# Patient Record
Sex: Female | Born: 1935 | Hispanic: No | Smoking: Never smoker
Health system: Southern US, Community
[De-identification: ages and names within clinical notes are randomized; demographics above are authoritative.]

## PROBLEM LIST (undated history)

## (undated) DIAGNOSIS — B029 Zoster without complications: Secondary | ICD-10-CM

## (undated) DIAGNOSIS — E079 Disorder of thyroid, unspecified: Secondary | ICD-10-CM

## (undated) DIAGNOSIS — M810 Age-related osteoporosis without current pathological fracture: Secondary | ICD-10-CM

## (undated) DIAGNOSIS — C801 Malignant (primary) neoplasm, unspecified: Secondary | ICD-10-CM

## (undated) DIAGNOSIS — A4902 Methicillin resistant Staphylococcus aureus infection, unspecified site: Secondary | ICD-10-CM

## (undated) HISTORY — PX: SKIN BIOPSY: SHX1

## (undated) HISTORY — PX: ABDOMINAL HYSTERECTOMY: SHX81

## (undated) HISTORY — PX: FRACTURE SURGERY: SHX138

---

## 2007-06-22 ENCOUNTER — Ambulatory Visit: Payer: Self-pay | Admitting: Ophthalmology

## 2007-08-03 ENCOUNTER — Ambulatory Visit: Payer: Self-pay | Admitting: Ophthalmology

## 2007-08-03 ENCOUNTER — Ambulatory Visit: Payer: Self-pay | Admitting: Cardiology

## 2007-08-03 ENCOUNTER — Other Ambulatory Visit: Payer: Self-pay

## 2009-12-22 ENCOUNTER — Ambulatory Visit: Payer: Self-pay | Admitting: Internal Medicine

## 2010-01-06 ENCOUNTER — Ambulatory Visit: Payer: Self-pay | Admitting: Internal Medicine

## 2010-01-15 ENCOUNTER — Ambulatory Visit: Payer: Self-pay | Admitting: Internal Medicine

## 2010-09-01 ENCOUNTER — Ambulatory Visit: Payer: Self-pay | Admitting: Internal Medicine

## 2011-09-21 ENCOUNTER — Ambulatory Visit: Payer: Self-pay

## 2013-08-23 ENCOUNTER — Ambulatory Visit: Payer: Self-pay | Admitting: Family Medicine

## 2013-09-23 ENCOUNTER — Ambulatory Visit: Payer: Self-pay | Admitting: Internal Medicine

## 2013-09-23 LAB — COMPREHENSIVE METABOLIC PANEL
ANION GAP: 12 (ref 7–16)
Albumin: 2.9 g/dL — ABNORMAL LOW (ref 3.4–5.0)
Alkaline Phosphatase: 117 U/L
BUN: 20 mg/dL — ABNORMAL HIGH (ref 7–18)
Bilirubin,Total: 0.7 mg/dL (ref 0.2–1.0)
CALCIUM: 8.7 mg/dL (ref 8.5–10.1)
CHLORIDE: 99 mmol/L (ref 98–107)
CO2: 26 mmol/L (ref 21–32)
Creatinine: 1.32 mg/dL — ABNORMAL HIGH (ref 0.60–1.30)
EGFR (Non-African Amer.): 34 — ABNORMAL LOW
GFR CALC AF AMER: 40 — AB
GLUCOSE: 105 mg/dL — AB (ref 65–99)
Osmolality: 277 (ref 275–301)
Potassium: 3.9 mmol/L (ref 3.5–5.1)
SGOT(AST): 31 U/L (ref 15–37)
SGPT (ALT): 24 U/L (ref 12–78)
Sodium: 137 mmol/L (ref 136–145)
Total Protein: 7.4 g/dL (ref 6.4–8.2)

## 2013-09-23 LAB — CBC WITH DIFFERENTIAL/PLATELET
BASOS ABS: 0 10*3/uL (ref 0.0–0.1)
Basophil %: 0.1 %
Eosinophil #: 0.1 10*3/uL (ref 0.0–0.7)
Eosinophil %: 0.3 %
HCT: 28.5 % — ABNORMAL LOW (ref 35.0–47.0)
HGB: 9.5 g/dL — ABNORMAL LOW (ref 12.0–16.0)
LYMPHS PCT: 14.6 %
Lymphocyte #: 2.6 10*3/uL (ref 1.0–3.6)
MCH: 32.6 pg (ref 26.0–34.0)
MCHC: 33.3 g/dL (ref 32.0–36.0)
MCV: 98 fL (ref 80–100)
MONO ABS: 3.8 x10 3/mm — AB (ref 0.2–0.9)
Monocyte %: 20.7 %
NEUTROS PCT: 64.3 %
Neutrophil #: 11.6 10*3/uL — ABNORMAL HIGH (ref 1.4–6.5)
Platelet: 168 10*3/uL (ref 150–440)
RBC: 2.91 10*6/uL — AB (ref 3.80–5.20)
RDW: 15 % — AB (ref 11.5–14.5)
WBC: 18.1 10*3/uL — ABNORMAL HIGH (ref 3.6–11.0)

## 2013-10-11 ENCOUNTER — Ambulatory Visit: Payer: Self-pay | Admitting: Physician Assistant

## 2013-10-11 LAB — URINALYSIS, COMPLETE
BILIRUBIN, UR: NEGATIVE
BLOOD: NEGATIVE
Glucose,UR: NEGATIVE mg/dL (ref 0–75)
KETONE: NEGATIVE
Leukocyte Esterase: NEGATIVE
NITRITE: NEGATIVE
Ph: 8 (ref 4.5–8.0)
Protein: NEGATIVE
Specific Gravity: 1.01 (ref 1.003–1.030)

## 2013-10-16 ENCOUNTER — Ambulatory Visit: Payer: Self-pay

## 2014-02-27 ENCOUNTER — Ambulatory Visit: Payer: Self-pay | Admitting: Family Medicine

## 2014-02-27 LAB — URINALYSIS, COMPLETE
BILIRUBIN, UR: NEGATIVE
GLUCOSE, UR: NEGATIVE mg/dL (ref 0–75)
Ketone: NEGATIVE
Leukocyte Esterase: NEGATIVE
Nitrite: NEGATIVE
Ph: 7 (ref 4.5–8.0)
Protein: 100
Specific Gravity: 1.025 (ref 1.003–1.030)

## 2014-03-01 LAB — URINE CULTURE

## 2015-04-14 ENCOUNTER — Ambulatory Visit
Admission: EM | Admit: 2015-04-14 | Discharge: 2015-04-14 | Disposition: A | Discharge status: Home / Self Care | Payer: Medicare Other | Attending: Family Medicine | Admitting: Family Medicine

## 2015-04-14 ENCOUNTER — Encounter: Payer: Self-pay | Admitting: *Deleted

## 2015-04-14 DIAGNOSIS — L820 Inflamed seborrheic keratosis: Secondary | ICD-10-CM | POA: Diagnosis not present

## 2015-04-14 DIAGNOSIS — L089 Local infection of the skin and subcutaneous tissue, unspecified: Secondary | ICD-10-CM

## 2015-04-14 DIAGNOSIS — R42 Dizziness and giddiness: Secondary | ICD-10-CM | POA: Diagnosis not present

## 2015-04-14 DIAGNOSIS — R221 Localized swelling, mass and lump, neck: Secondary | ICD-10-CM | POA: Diagnosis not present

## 2015-04-14 HISTORY — DX: Zoster without complications: B02.9

## 2015-04-14 MED ORDER — MUPIROCIN 2 % EX OINT: 1.0000 "application " | TOPICAL_OINTMENT | Freq: Three times a day (TID) | CUTANEOUS | Status: DC

## 2015-04-14 NOTE — ED Notes (Signed)
Pt states that she is having painful swelling on the left side of her neck, started 1 week ago.

## 2015-04-14 NOTE — ED Provider Notes (Addendum)
CSN: [id number]     Arrival date & time 04/14/15  1342 History   First MD Initiated Contact with Patient 04/14/15 1455     Chief Complaint  Patient presents with  . Neck Pain   son brings patient in multiple problems multiple complaints. The #1 complaint is her neck pain. This neck pain is really a neck mass has been biopsied according to the son multiple years ago by Dr. Nehemiah Massed at [facility] dermatology. He states that at that time they were told that there was an internal cancer and because of her age nothing was to be done should be done. They were going to just watch her. Apparently over the last 1 or 2 weeks a mass has appeared and is grown. He cannot say whether this is the same exact site that was biopsied but obvious disc concern area unfortunate patient has a lot of other concern and during the time when the son is trying to explain things along with caregiver that you use it provides care for her on a weekly basis she is talking and has a lot of other concerns as well. 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 Son reports that she's having difficult sleeping because the discomfort and pain from this so he wants to have further evaluation done but the same time he does want her on any pain medication because as he states it Motrin 200 mg makes her loopy. Discuss with them whether evaluation at [facility] or [facility] might be better for biopsies can be done. He wants initial evaluation to be done in [place]. He states that he has difficult to getting her into see her PCP since she is already on Friday and he cycle be available this Friday because of his previous engagements.  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 He states that most times able to soak the lesion and it clears at this time lesion on the left forearm  healed very strange where that it was a lot of redundant tissue present. Lesions only small now but they're also concerned about that.  Third problem is a large lesion on the back of the been using a type and doing term bandage on this been bleeding and oozing for the last week or 2. Son states he thinks his mother's been scratching that area.   What was presented first but was the probable least treatable is that for the last 2 weeks she's been having episodic vertigo. While it was felt that I need to be notified of that problem they state that they're not concerned because she had vertigo years ago it waxes and wanes and has been much worst in the past and resolved on its own. Also they really don't want her on a extra medications either.   (Consider location/radiation/quality/duration/timing/severity/associated sxs/prior Treatment) Patient is a 81 y.o. female presenting with neck pain. The history is provided by the patient and a relative (the son).  Neck Pain Pain location:  Unable to specify Pain radiates to:  Does not radiate Pain severity:  Moderate Pain is:  Worse during the night Progression:  Waxing and waning Chronicity:  Recurrent Context: not fall, not lifting a heavy object, not MCA, not MVA, not pedestrian accident and not recent injury   Relieved by:  Nothing Worsened by:  Nothing tried Associated symptoms comment:  Cancer Risk factors: no hx of head and neck radiation, no hx of osteoporosis, no hx of spinal trauma, no recent epidural and no recent head injury     Past Medical History  Diagnosis Date  . Shingles    History reviewed. No pertinent past surgical history. No family history on file. Social History  Substance Use Topics  . Smoking status: Never Smoker   . Smokeless tobacco: None  . Alcohol Use: No   OB History    No data available     Review of Systems  Unable to perform ROS Constitutional: Positive for activity change.  Musculoskeletal: Positive  for neck pain.  Skin: Positive for color change and wound.  Neurological: Positive for dizziness.       Recurrent vertigo    Allergies  Review of patient's allergies indicates no known allergies.  Home Medications   Prior to Admission medications   Medication Sig Start Date End Date Taking? Authorizing Provider  aspirin 81 MG tablet Take 81 mg by mouth daily.   Yes Historical Provider, MD  Calcium-Vitamin D-Vitamin K (VIACTIV PO) Take by mouth.   Yes Historical Provider, MD  cetirizine (ZYRTEC) 10 MG tablet Take 10 mg by mouth daily.   Yes Historical Provider, MD  Cobalamine Combinations (FOLIC + N02) [id number] MCG TABS Take by mouth.   Yes Historical Provider, MD  cyanocobalamin 1000 MCG tablet Take 100 mcg by mouth daily.   Yes Historical Provider, MD  Docusate Sodium (COLACE PO) Take by mouth.   Yes Historical Provider, MD  ferrous sulfate 325 (65 FE) MG tablet Take 325 mg by mouth 2 (two) times daily with a meal.   Yes Historical Provider, MD  levothyroxine (SYNTHROID, LEVOTHROID) 125 MCG tablet Take 125 mcg by mouth daily before breakfast.   Yes Historical Provider, MD  mupirocin ointment (BACTROBAN) 2 % Apply 1 application topically 3 (three) times daily. 04/14/15   Frederich Cha, MD   Meds Ordered and Administered this Visit  Medications - No data to display  BP 119/67 mmHg  Pulse 88  Temp(Src) 98.5 F (36.9 C) (Oral)  Ht 5' (1.524 m)  Wt 99 lb (44.906 kg)  BMI 19.33 kg/m2  SpO2 100% No data found.   Physical Exam  Constitutional:  Very cachectic elderly white frail female  HENT:  Head: Atraumatic.    Right Ear: Tympanic membrane and external ear normal.  Left Ear: Tympanic membrane and external ear normal.  Nose: Nose normal.  Mouth/Throat: Oropharynx is clear and moist. Mucous membranes are dry. Normal dentition.  She has a very prominent and large lesion on the left side of her neck more towards the back. There is no signs of fluctuance or abscess and lesion  appears to be solid.  Eyes: Pupils are equal, round, and reactive to light.  Neck: Neck supple. No thyromegaly present.  Neurological:  Patient shows some confusion and rambling of speech and conversation.  Skin: Skin is warm. Rash noted. There is erythema.     Psychiatric: She has a normal mood and affect.  Vitals reviewed.   ED Course  Procedures (including critical care time)  Labs Review Labs Reviewed - No data to display  Imaging Review US Soft Tissue Head/neck  04/15/2015   CLINICAL DATA:  81 year old female with left neck mass  EXAM: ULTRASOUND OF HEAD/NECK SOFT TISSUES  TECHNIQUE: Ultrasound examination of the head and neck soft tissues was performed in the area  of clinical concern.  COMPARISON:  None.  FINDINGS: Sonographic interrogation of the region of palpable abnormality demonstrates a complex cystic structure with internal debris, and multiple vascular septations. The complex cystic mass measures approximately 4.0 x 2.0 x 3.6 cm. The mass begins just inferior to the earlobe and is just superficial to the neurovascular bundle.  IMPRESSION: Approximately 4 cm complex cystic mass with internal debris and vascular septation in the superficial soft tissues of the left neck. Differential considerations include abscess, necrotic malignant lymphadenopathy, and atypical (tuberculous) lymphadenitis.  Recommend further evaluation with CT scan of the neck with contrast.   Electronically Signed   By: Jacqulynn Cadet M.D.   On: 04/15/2015 14:47     Visual Acuity Review  Right Eye Distance:   Left Eye Distance:   Bilateral Distance:    Right Eye Near:   Left Eye Near:    Bilateral Near:         MDM   1. Mass of left side of neck   2. Vertigo   3. Keratosis, inflamed seborrheic   4. Infected skin lesion    #1 During this time felt that the left forearm is healing well and the may been some redundant tissue removed at nothing to do differently other than using the  Bactroban ointment prescribed for problem #2. #2 infected seborrheic keratosis. She has rather large seborrheic keratosis over her neck that bleeding as very angry. Apparently she's been scratching this lesion and is now bleeding. Put the duodenum dressing back on.strongly recommend Bactrim and ointment 1-3 times a day on the neck.  Follow-up with her PCP. Some inquire with a weaker freeze this lesion and the lesion the size of her not feel comfortable freezing. Dermatologist may be able to remove it but the same time because of her age not sure they will want to try to tackle remove this lesion.  #3 vertigo at this point time due nothing differently. #4 and most important thing is the mass on the left side of the neck this causing pain. I've explained to the son I will order ultrasound of the area to see whether this is solid or cystic antegrade get some feedback from the radiologist. Biopsy is going to have to be done but at her age this could be another issue as well. False 6. them that radiologist may want CT scan of her neck and then passed case they may have to travel from [facility] to [facility] He has given [place] his [phone] requested he be called with enough time to go from [facility] to her house to pick her up and bring her here do our best to get the ultrasound per his request on Monday morning with this being present for over a week I can't in good conscious ordered this stat.    Frederich Cha, MD 04/14/15 1809    Discussed the results of the ultrasound in person with patient and her son on 04-15-15 after the procedure was done. Given some copies of the ultrasound to the son and explained as I was concerned that CT scan is now being recommended and that also needs to be a CT scan with contrast so she will need at work to evaluate kidney function was not done yesterday. I stressed with him diagnosis of a neck cancer is still possible that I would recommend as I did yesterday referral to ENT at  [facility]/interventionalists radiologist to make the decision how best to evaluate her at this time. Get a biopsy  might be performed at the time of the CT scan but I can't say for sure. Since this is a ongoing problem and she has had a previous biopsy of the neck with results and I do not have access to I strongly suggest he goes through her PCP who has this information to figure out what's the best next step. She was able to sleep last night while they want answers I do not feel that just getting a CT scan at [facility] is our next best step. He has made appointment with her PCP for next week Thursday but he still seems concerned that they will have to wait till then. I explained to him that he might be able to give a report of the radiologist to the office and they maybe want to go ahead with the referral considering her age. His promised me he will contact her PCP office for the next step.  Frederich Cha, MD 04/15/15 [phone]

## 2015-04-14 NOTE — Discharge Instructions (Signed)
Seborrheic Keratosis Seborrheic keratosis is a common, noncancerous (benign) skin growth that can occur anywhere on the skin.It looks like "stuck-on," waxy, rough, tan, brown, or black spots on the skin. These skin growths can be flat or raised.They are often called "barnacles" because of their pasted-on appearance.Usually, these skin growths appear in adulthood, around age 79, and increase in number as you age. They may also develop during pregnancy or following estrogen therapy. Many people may only have one growth appear in their lifetime, while some people may develop many growths. CAUSES It is unknown what causes these skin growths, but they appear to run in families. SYMPTOMS Seborrheic keratosis is often located on the face, chest, shoulders, back, or other areas. These growths are:  Usually painless, but may become irritated and itchy.  Yellow, brown, black, or other colors.  Slightly raised or have a flat surface.  Sometimes rough or wart-like in texture.  Often waxy on the surface.  Round or oval-shaped.  Sometimes "stuck-on" in appearance.  Sometimes single, but there are usually many growths. Any growth that bleeds, itches on a regular basis, becomes inflamed, or becomes irritated needs to be evaluated by a skin specialist (dermatologist). DIAGNOSIS Diagnosis is mainly based on the way the growths appear. In some cases, it can be difficult to tell this type of skin growth from skin cancer. A skin growth tissue sample (biopsy) may be used to confirm the diagnosis. TREATMENT Most often, treatment is not needed because the skin growths are benign.If the skin growth is irritated easily by clothing or jewelry, causing it to scab or bleed, treatment may be recommended. Patients may also choose to have the growths removed because they do not like their appearance. Most commonly, these growths are treated with cryosurgery. In cryosurgery, liquid nitrogen is applied to "freeze" the  growth. The growth usually falls off within a matter of days. A blister may form and dry into a scab that will also fall off. After the growth or scab falls off, it may leave a dark or light spot on the skin. This color may fade over time, or it may remain permanent on the skin. HOME CARE INSTRUCTIONS If the skin growths are treated with cryosurgery, the treated area needs to be kept clean with water and soap. SEEK MEDICAL CARE IF:  You have questions about these growths or other skin problems.  You develop new symptoms, including:  A change in the appearance of the skin growth.  New growths.  Any bleeding, itching, or pain in the growths.  A skin growth that looks similar to seborrheic keratosis. Document Released: 08/22/2010 Document Revised: 10/12/2011 Document Reviewed: 08/22/2010 [facility] Patient Information 2015 [place], [place]. This information is not intended to replace advice given to you by your health care provider. Make sure you discuss any questions you have with your health care provider.  Vertigo Vertigo means you feel like you are moving when you are not. Vertigo can make you feel like things around you are moving when they are not. This problem often goes away on its own.  HOME CARE   Follow your doctor's instructions.  Avoid driving.  Avoid using heavy machinery.  Avoid doing any activity that could be dangerous if you have a vertigo attack.  Tell your doctor if a medicine seems to cause your vertigo. GET HELP RIGHT AWAY IF:   Your medicines do not help or make you feel worse.  You have trouble talking or walking.  You feel weak or have  trouble using your arms, hands, or legs.  You have bad headaches.  You keep feeling sick to your stomach (nauseous) or throwing up (vomiting).  Your vision changes.  A family member notices changes in your behavior.  Your problems get worse. MAKE SURE YOU:  Understand these instructions.  Will watch your  condition.  Will get help right away if you are not doing well or get worse. Document Released: 04/28/2008 Document Revised: 10/12/2011 Document Reviewed: 02/05/2011 [facility] Patient Information 2015 [place], [place]. This information is not intended to replace advice given to you by your health care provider. Make sure you discuss any questions you have with your health care provider.

## 2015-04-15 ENCOUNTER — Ambulatory Visit
Admission: RE | Admit: 2015-04-15 | Discharge: 2015-04-15 | Disposition: A | Discharge status: Home / Self Care | Payer: Medicare Other | Source: Ambulatory Visit | Attending: Family Medicine | Admitting: Family Medicine

## 2015-04-15 DIAGNOSIS — R221 Localized swelling, mass and lump, neck: Secondary | ICD-10-CM | POA: Diagnosis not present

## 2017-01-06 ENCOUNTER — Ambulatory Visit: Payer: Medicare Other

## 2017-01-06 ENCOUNTER — Encounter: Payer: Self-pay | Admitting: *Deleted

## 2017-01-06 ENCOUNTER — Ambulatory Visit
Admission: EM | Admit: 2017-01-06 | Discharge: 2017-01-06 | Disposition: A | Discharge status: Home / Self Care | Payer: Medicare Other | Attending: Family Medicine | Admitting: Family Medicine

## 2017-01-06 DIAGNOSIS — Z23 Encounter for immunization: Secondary | ICD-10-CM | POA: Diagnosis not present

## 2017-01-06 DIAGNOSIS — S0101XA Laceration without foreign body of scalp, initial encounter: Secondary | ICD-10-CM | POA: Diagnosis not present

## 2017-01-06 DIAGNOSIS — Z79899 Other long term (current) drug therapy: Secondary | ICD-10-CM | POA: Diagnosis not present

## 2017-01-06 DIAGNOSIS — Z7982 Long term (current) use of aspirin: Secondary | ICD-10-CM | POA: Diagnosis not present

## 2017-01-06 DIAGNOSIS — E079 Disorder of thyroid, unspecified: Secondary | ICD-10-CM | POA: Insufficient documentation

## 2017-01-06 DIAGNOSIS — R059 Cough, unspecified: Secondary | ICD-10-CM

## 2017-01-06 DIAGNOSIS — Y92009 Unspecified place in unspecified non-institutional (private) residence as the place of occurrence of the external cause: Secondary | ICD-10-CM | POA: Diagnosis not present

## 2017-01-06 DIAGNOSIS — W1830XA Fall on same level, unspecified, initial encounter: Secondary | ICD-10-CM | POA: Diagnosis not present

## 2017-01-06 DIAGNOSIS — F0390 Unspecified dementia without behavioral disturbance: Secondary | ICD-10-CM | POA: Diagnosis not present

## 2017-01-06 DIAGNOSIS — S0093XA Contusion of unspecified part of head, initial encounter: Secondary | ICD-10-CM

## 2017-01-06 DIAGNOSIS — S2242XA Multiple fractures of ribs, left side, initial encounter for closed fracture: Secondary | ICD-10-CM | POA: Diagnosis not present

## 2017-01-06 DIAGNOSIS — J441 Chronic obstructive pulmonary disease with (acute) exacerbation: Secondary | ICD-10-CM | POA: Insufficient documentation

## 2017-01-06 DIAGNOSIS — R05 Cough: Secondary | ICD-10-CM | POA: Diagnosis not present

## 2017-01-06 DIAGNOSIS — W19XXXA Unspecified fall, initial encounter: Secondary | ICD-10-CM | POA: Diagnosis not present

## 2017-01-06 DIAGNOSIS — S0191XA Laceration without foreign body of unspecified part of head, initial encounter: Secondary | ICD-10-CM | POA: Diagnosis present

## 2017-01-06 DIAGNOSIS — S0003XA Contusion of scalp, initial encounter: Secondary | ICD-10-CM | POA: Diagnosis not present

## 2017-01-06 DIAGNOSIS — S0990XA Unspecified injury of head, initial encounter: Secondary | ICD-10-CM

## 2017-01-06 HISTORY — DX: Malignant (primary) neoplasm, unspecified: C80.1

## 2017-01-06 HISTORY — DX: Disorder of thyroid, unspecified: E07.9

## 2017-01-06 HISTORY — DX: Age-related osteoporosis without current pathological fracture: M81.0

## 2017-01-06 HISTORY — DX: Methicillin resistant Staphylococcus aureus infection, unspecified site: A49.02

## 2017-01-06 MED ORDER — AZITHROMYCIN 200 MG/5ML PO SUSR: ORAL | 0 refills | Status: AC

## 2017-01-06 MED ORDER — TETANUS-DIPHTHERIA TOXOIDS TD 5-2 LFU IM INJ
0.5000 mL | INJECTION | Freq: Once | INTRAMUSCULAR | Status: AC
  Administered 2017-01-06: 0.5 mL via INTRAMUSCULAR

## 2017-01-06 MED ORDER — MUPIROCIN 2 % EX OINT: 1.0000 "application " | TOPICAL_OINTMENT | Freq: Two times a day (BID) | CUTANEOUS | 0 refills | Status: DC

## 2017-01-06 MED ORDER — MUPIROCIN 2 % EX OINT: 1.0000 "application " | TOPICAL_OINTMENT | Freq: Two times a day (BID) | CUTANEOUS | 0 refills | Status: AC

## 2017-01-06 NOTE — ED Provider Notes (Signed)
[facility]    CSN: [id number] Arrival date & time: 01/06/17  1532     History   Chief Complaint Chief Complaint  Patient presents with  . Head Laceration  . Bleeding/Bruising  . Fall    HPI Kathryn Hoffman is a 83 y.o. female.   Son brings his 34 year old mother in apparently he went to granddaughter's graduation he states his mother will [place] that she was stay inside the house but after they left she decided to try to eat her food on the back. Apparently during this time she is slipped and fell and hit her head. Took a while to get up. No one witnessed the fall. She is on a baby aspirin. She's been falling much more lately. According to son she appears to be pre-much back at baseline except that she has multiple pains and aches she hurts in her ribs both elbows a bruise she has laceration over right side of her skull. 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 Past smoking history is significant for previous atrophy some dementia and confusion and the metastatic lesions on both jaws. He also reports she's been coughing is having he felt some increased shortness of breath as well.    The history is provided by the patient and a relative. No language interpreter was used.  Head Laceration  This is a new problem. The current episode started 1 to 2 hours ago. The problem has not changed since onset.Pertinent negatives include no chest pain, no abdominal pain, no headaches and no shortness of breath. The symptoms are aggravated by walking. Nothing relieves the symptoms. She has tried nothing for  the symptoms. The treatment provided no relief.  Fall  Pertinent negatives include no chest pain, no abdominal pain, no headaches and no shortness of breath.    Past Medical History:  Diagnosis Date  . Cancer ([place])   . MRSA (methicillin resistant Staphylococcus aureus)   . Osteoporosis   . Shingles   . Thyroid disease     There are no active problems to display for this patient.   Past Surgical History:  Procedure Laterality Date  . ABDOMINAL HYSTERECTOMY    . FRACTURE SURGERY    . SKIN BIOPSY      OB History    No data available       Home Medications    Prior to Admission medications   Medication Sig Start Date End Date Taking? Authorizing Provider  aspirin 81 MG tablet Take 81 mg by mouth daily.   Yes [provider]  Calcium-Vitamin D-Vitamin K (VIACTIV PO) Take by mouth.   Yes [provider]  cetirizine (ZYRTEC) 10 MG tablet Take 10 mg by mouth daily.   Yes [provider]  Cobalamine Combinations (FOLIC + O70) [id number] MCG TABS Take by mouth.   Yes [provider]  cyanocobalamin 1000 MCG tablet Take 100 mcg by mouth daily.   Yes [provider]  Docusate Sodium (COLACE PO) Take by mouth.   Yes [provider]  ferrous sulfate 325 (65 FE) MG  tablet Take 325 mg by mouth 2 (two) times daily with a meal.   Yes [provider]  levothyroxine (SYNTHROID, LEVOTHROID) 125 MCG tablet Take 125 mcg by mouth daily before breakfast.   Yes [provider]  azithromycin (ZITHROMAX) 200 MG/5ML suspension 2 teaspoons daily for 5 days. 01/06/17   Frederich Cha, MD  mupirocin ointment (BACTROBAN) 2 % Apply 1 application topically 2 (two) times daily. 01/06/17   Frederich Cha, MD    Family History History reviewed. No pertinent family history.  Social History Social History  Substance Use Topics  . Smoking status: Never Smoker  . Smokeless tobacco: Never Used  . Alcohol use No     Allergies   Patient has  no known allergies.   Review of Systems Review of Systems  Unable to perform ROS: Dementia  HENT: Positive for congestion.   Respiratory: Positive for cough. Negative for shortness of breath.   Cardiovascular: Negative for chest pain.  Gastrointestinal: Negative for abdominal pain.  Neurological: Negative for headaches.     Physical Exam Triage Vital Signs ED Triage Vitals  Enc Vitals Group     BP --      Pulse Rate 01/06/17 1549 91     Resp 01/06/17 1549 16     Temp 01/06/17 1549 97.5 F (36.4 C)     Temp Source 01/06/17 1549 Oral     SpO2 01/06/17 1549 99 %     Weight 01/06/17 1551 118 lb (53.5 kg)     Height 01/06/17 1551 5\' 2"  (1.575 m)     Head Circumference --      Peak Flow --      Pain Score 01/06/17 1551 2     Pain Loc --      Pain Edu? --      Excl. in [place]? --    No data found.   Updated Vital Signs Pulse 91   Temp 97.5 F (36.4 C) (Oral)   Resp 16   Ht 5\' 2"  (1.575 m)   Wt 118 lb (53.5 kg)   SpO2 99%   BMI 21.58 kg/m   Visual Acuity Right Eye Distance:   Left Eye Distance:   Bilateral Distance:    Right Eye Near:   Left Eye Near:    Bilateral Near:     Physical Exam  Constitutional: She appears cachectic. She is cooperative.  Elderly frail white female difficulty hearing which not surprising and difficulty with ambulation and motion  HENT:  Head:    Right Ear: External ear normal.  Left Ear: External ear normal.  She has some small laceration on the right side which are not deep or bleeding profusely or significantly at this time. of her forehead pupils are poorly reactive facial atrophy cystic taking both sides of the jaw which is the cancer according to her son has metastasized both sides from her intestinal tract  Eyes: Conjunctivae and lids are normal. Pupils are equal, round, and reactive to light.  Pupils reactive but not brisk  Neck: Trachea normal and full passive range of motion without pain.  Cardiovascular: Regular rhythm and  normal heart sounds.   Pulmonary/Chest: Effort normal and breath sounds normal.  Musculoskeletal: She exhibits tenderness.       Thoracic back: She exhibits tenderness, bony tenderness and pain.       Back:  Patient marked kyphosis and tenderness over both left and right ribs and thoracic spine  Neurological: She is alert.  Skin: Skin is  warm.  Psychiatric: Her mood appears not anxious. Cognition and memory are impaired.  Vitals reviewed.    UC Treatments / Results  Labs (all labs ordered are listed, but only abnormal results are displayed) Labs Reviewed - No data to display  EKG  EKG Interpretation None       Radiology Dg Ribs Bilateral W/chest  Result Date: 01/06/2017 CLINICAL DATA:  [place] at home today, posterior BILATERAL back and rib pain, history osteoporosis EXAM: BILATERAL RIBS AND CHEST - 4+ VIEW COMPARISON:  Chest radiograph 09/23/2013 FINDINGS: Normal heart size, mediastinal contours, and pulmonary vascularity. Atherosclerotic calcification aorta. Mitral annular calcification noted. Emphysematous and bronchitic changes consistent with COPD. BILATERAL upper lobe scarring and volume loss with superior retraction of the hila. No infiltrate, pleural effusion, or pneumothorax. Marked osseous demineralization. Lower thoracic compression fracture at T11. Minimally displaced fractures of the posterior LEFT ninth and tenth ribs. No additional fractures identified. IMPRESSION: COPD changes without acute infiltrate. Aortic atherosclerosis. Minimally displaced fractures of the posterior LEFT ninth and tenth ribs. Electronically Signed   By: Lavonia Dana M.D.   On: 01/06/2017 17:40   Dg Thoracic Spine 2 View  Result Date: 01/06/2017 CLINICAL DATA:  [place] at home today, back and rib pain EXAM: THORACIC SPINE 2 VIEWS COMPARISON:  Chest radiographs 09/23/2013 FINDINGS: Osseous demineralization. Twelve pairs of ribs. Chronic superior endplate compression fracture of T11, with estimated 60%  anterior height loss, unchanged. Mild superior endplate concavity is at L1-L2, unchanged. Assessment of upper thoracic vertebra is limited by the severe degree of osseous demineralization. No definite additional fractures are delineated. IMPRESSION: Chronic compression fracture of T11 with chronic superior endplate concavities at L1 and L2. Marked osseous demineralization limiting assessment of the upper thoracic spine, without definite additional thoracic spine fracture identified. Electronically Signed   By: Lavonia Dana M.D.   On: 01/06/2017 17:42   Ct Head Wo Contrast  Result Date: 01/06/2017 CLINICAL DATA:  Head injury after fall. EXAM: CT HEAD WITHOUT CONTRAST TECHNIQUE: Contiguous axial images were obtained from the base of the skull through the vertex without intravenous contrast. COMPARISON:  None. FINDINGS: Brain: Mild diffuse cortical atrophy is noted. Mild chronic ischemic white matter disease is noted. Encephalomalacia is noted in right posterior parietal region consistent with old infarction. No mass effect or midline shift is noted. Ventricular size is within normal limits. There is no evidence of mass lesion, hemorrhage or acute infarction. Vascular: Atherosclerosis of carotid siphons is noted. Skull: Normal. Negative for fracture or focal lesion. Sinuses/Orbits: Mild right ethmoid sinusitis is noted. Other: None. IMPRESSION: Mild diffuse cortical atrophy. Mild chronic ischemic white matter disease. 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 No acute intracranial abnormality seen. Electronically Signed   By: Marijo Conception, M.D.   On: 01/06/2017 17:57    Procedures Procedures (including critical care time)  Medications Ordered in UC Medications  tetanus & diphtheria toxoids (adult) (TENIVAC) injection 0.5 mL (0.5 mLs Intramuscular Given 01/06/17 1757)     Initial Impression / Assessment and Plan / UC Course  I have reviewed the triage vital signs and the nursing  notes.  Pertinent labs & imaging results that were available during my care of the patient were reviewed by me and considered in my medical decision making (see chart for details).     While patient follow-up with her PCP in about 2 days ago placed on Actos band for the head laceration and keep it from getting infected the 2 lacerations on the right side she  does have what appears be a goose egg as well. CT scan showed atrophy and ischemic changes but no signs of bleed her son was warned though if her mental status changes because more confused he'll need to take her to the ED of their choice. #2 coughing congestion she does have COPD by chest x-ray will go ahead and place on Zithromax 200 mg 2 teaspoons daily for the next 5 days with total 400 mg daily for 5 days.  We decided not to place or any medication for the fracture risk is increased fall but son is aware of that. Also with fractured ribs (maximum pneumonia from occurring since she may be taking deep breaths. Son has agreed to the point she will need to be watched 24 7  Final Clinical Impressions(s) / UC Diagnoses   Final diagnoses:  Contusion of head  Injury of head, initial encounter  Laceration of scalp, initial encounter  Fall in home, initial encounter  Cough  Closed fracture of multiple ribs of left side, initial encounter  COPD with exacerbation ([place])  Contusion of scalp, initial encounter    New Prescriptions Discharge Medication List as of 01/06/2017  6:14 PM    START taking these medications   Details  azithromycin (ZITHROMAX) 200 MG/5ML suspension 2 teaspoons daily for 5 days., Normal       Note: This dictation was prepared with Dragon dictation along with smaller phrase technology. Any transcriptional errors that result from this process are unintentional..   Frederich Cha, MD 01/06/17 1851

## 2017-01-06 NOTE — ED Triage Notes (Signed)
Patient fell today at home lacerating the side of her head and bruising her arms bilaterally. Small abrasion located mid left back.

## 2017-08-03 DEATH — deceased

## 2019-01-07 IMAGING — CT CT HEAD W/O CM
4 series · 17 of 47 positions shown, 19 images · non-contrast
Comparison: None.

CLINICAL DATA: Head injury after fall.

EXAM:
CT HEAD WITHOUT CONTRAST
TECHNIQUE: Contiguous axial images were obtained from the base of the skull
through the vertex without intravenous contrast.

[Series 2: head wo · axial · 0.40mm/px · z∈[-101,+9]mm · 6 of 32 slices shown, 8 images]
[im 5/32  brain]
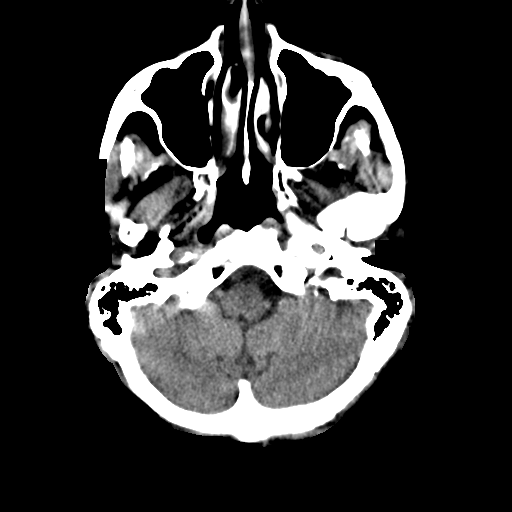
[im 5/32  bone]
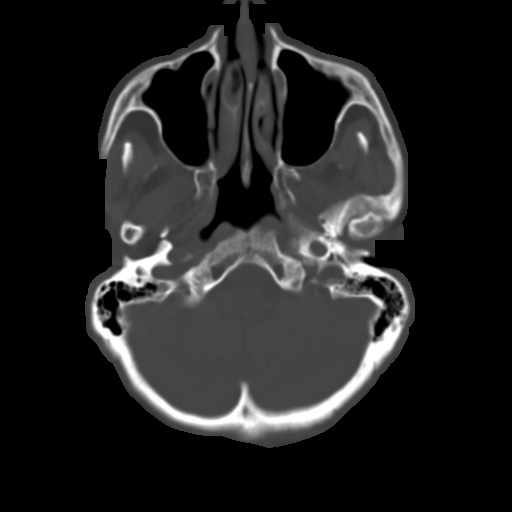
[im 9/32  brain]
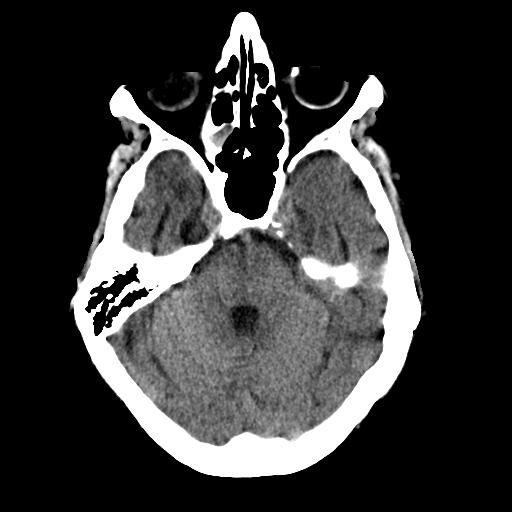
[im 14/32  brain]
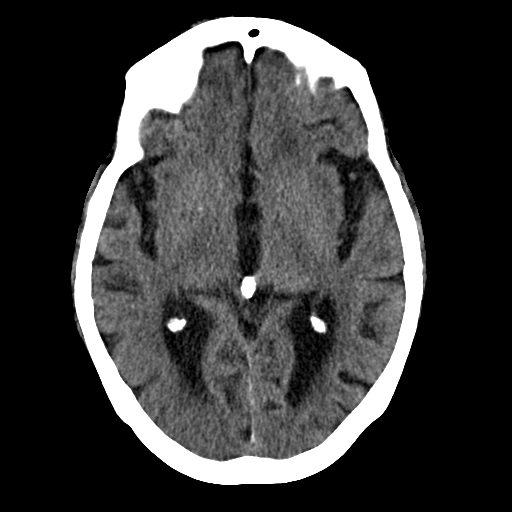
[im 18/32  brain]
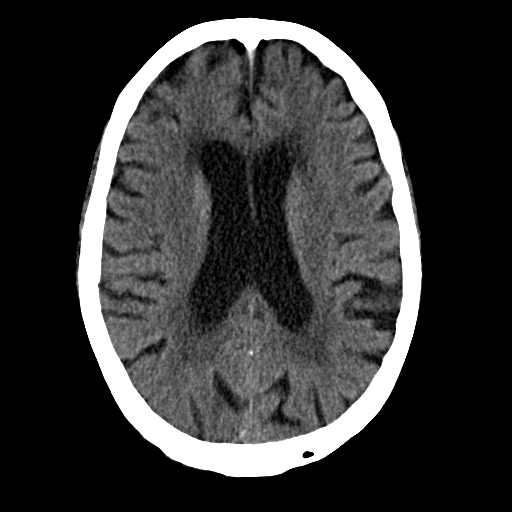
[im 23/32  brain]
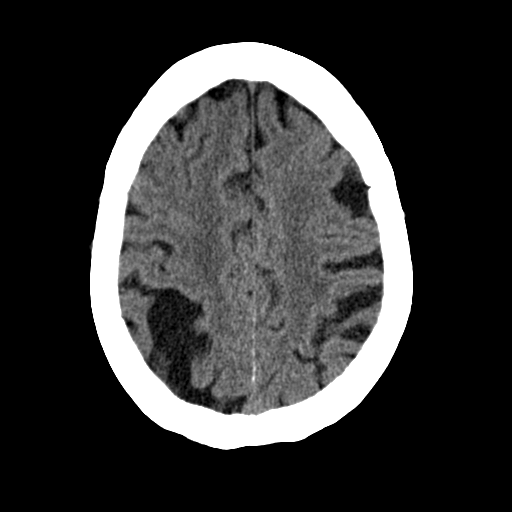
[im 23/32  bone]
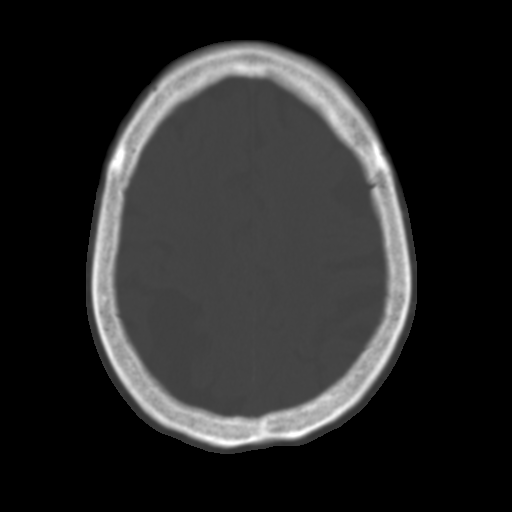
[im 27/32  brain]
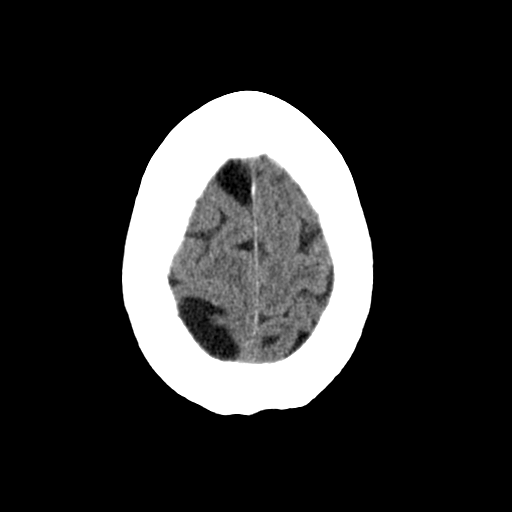

[Series 3: head bone · axial · 0.40mm/px · z∈[-109,-33]mm · 5 of 81 slices shown]
[im 8/81  bone]
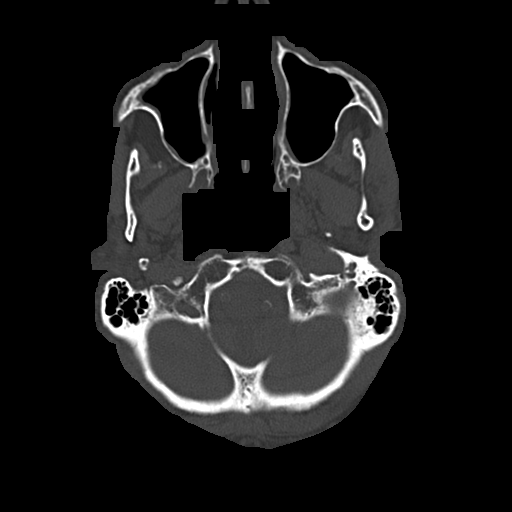
[im 16/81  bone]
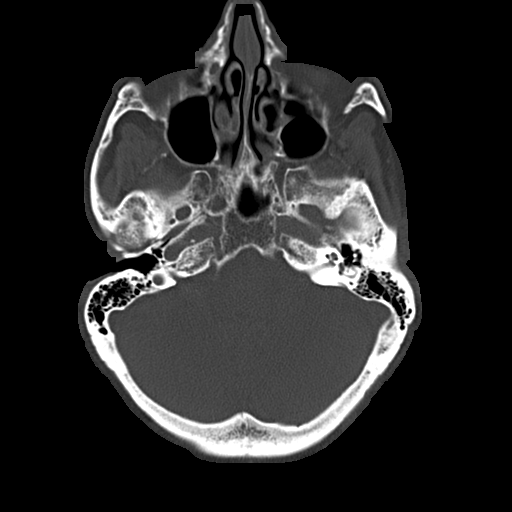
[im 27/81  bone]
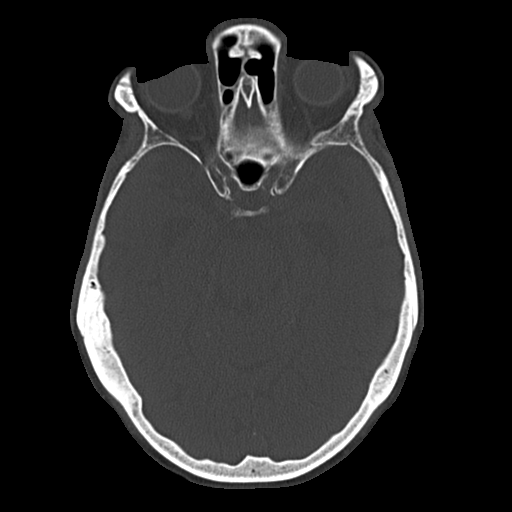
[im 35/81  bone]
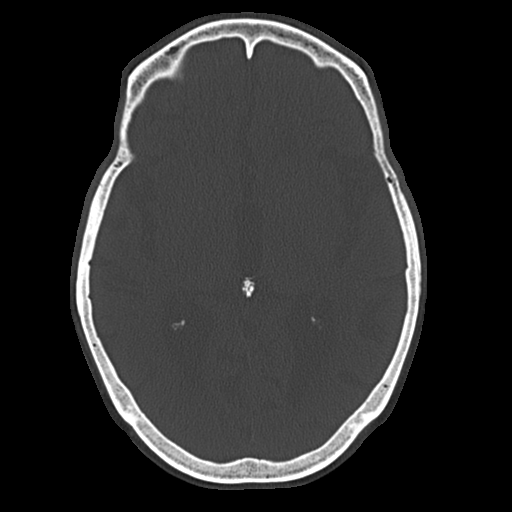
[im 46/81  bone]
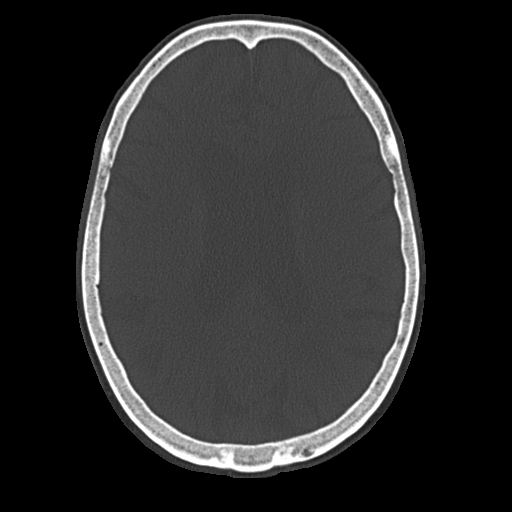

[Series 602: coronal · coronal · 0.40mm/px · 3 of 68 slices shown]
[im 23/68  brain]
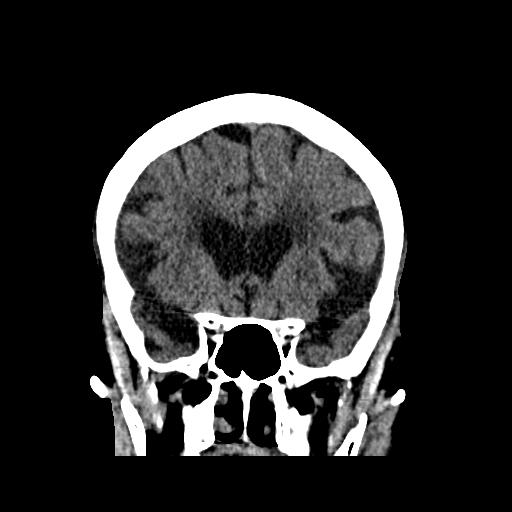
[im 30/68  brain]
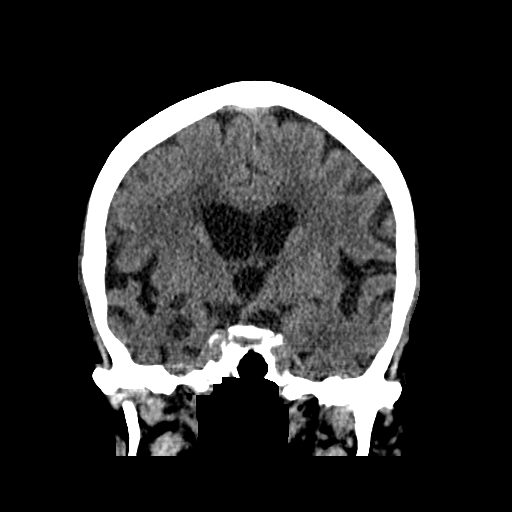
[im 38/68  brain]
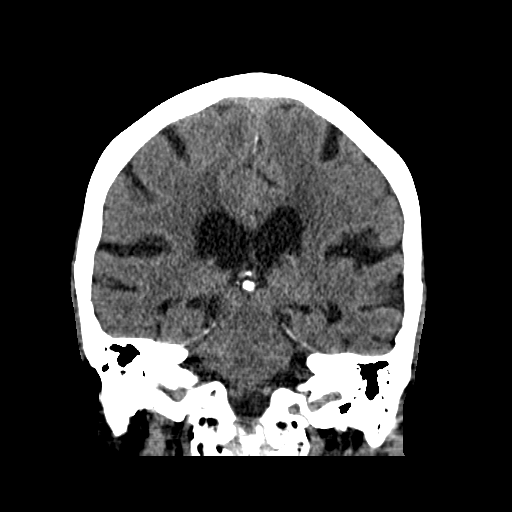

[Series 603: sagittal · sagittal · 0.40mm/px · 3 of 53 slices shown]
[im 18/53  brain]
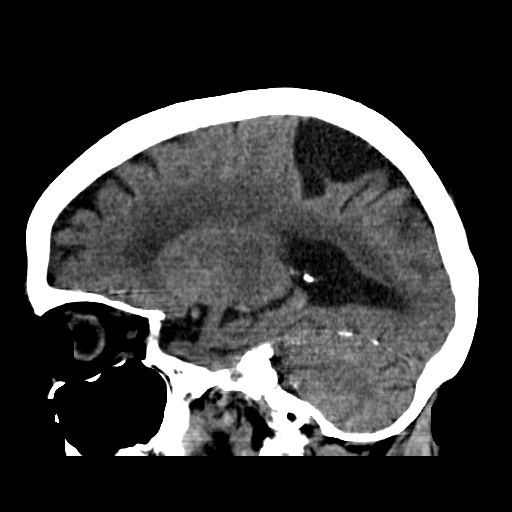
[im 27/53  brain]
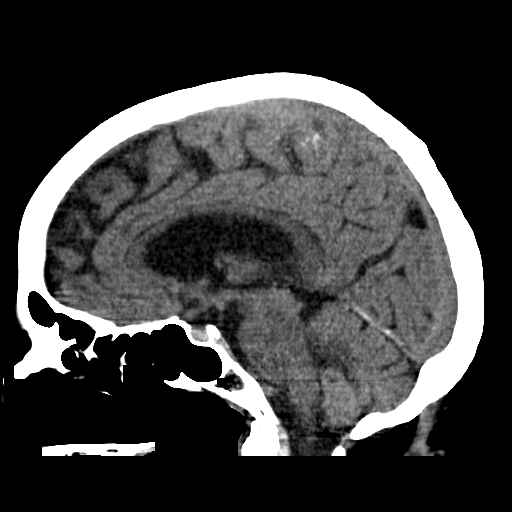
[im 35/53  brain]
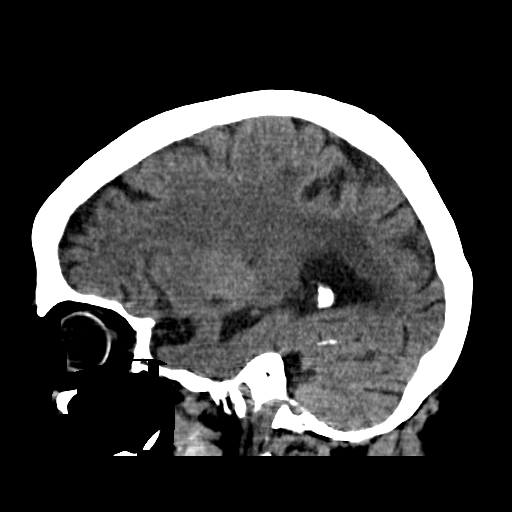

[17 of 47 positions shown; findings below may reference images not displayed]

FINDINGS: Brain: Mild diffuse cortical atrophy is noted. Mild chronic ischemic
white matter disease is noted. Encephalomalacia is noted in right
posterior parietal region consistent with old infarction. No mass
effect or midline shift is noted. Ventricular size is within normal
limits. There is no evidence of mass lesion, hemorrhage or acute
infarction.

Vascular: Atherosclerosis of carotid siphons is noted.

Skull: Normal. Negative for fracture or focal lesion.

Sinuses/Orbits: Mild right ethmoid sinusitis is noted.

Other: None.
IMPRESSION: Mild diffuse cortical atrophy. Mild chronic ischemic white matter
disease. Probable old right posterior parietal infarction. No acute
intracranial abnormality seen.
